# Patient Record
Sex: Male | Born: 1970 | Race: White | Hispanic: Yes | Marital: Single | Smoking: Current some day smoker
Health system: Southern US, Community
[De-identification: ages and names within clinical notes are randomized; demographics above are authoritative.]

## PROBLEM LIST (undated history)

## (undated) DIAGNOSIS — E119 Type 2 diabetes mellitus without complications: Secondary | ICD-10-CM

## (undated) DIAGNOSIS — Z720 Tobacco use: Secondary | ICD-10-CM

## (undated) DIAGNOSIS — E781 Pure hyperglyceridemia: Secondary | ICD-10-CM

## (undated) DIAGNOSIS — Z9114 Patient's other noncompliance with medication regimen: Secondary | ICD-10-CM

## (undated) DIAGNOSIS — I10 Essential (primary) hypertension: Secondary | ICD-10-CM

## (undated) DIAGNOSIS — R0789 Other chest pain: Secondary | ICD-10-CM

## (undated) DIAGNOSIS — Z91148 Patient's other noncompliance with medication regimen for other reason: Secondary | ICD-10-CM

---

## 2008-12-10 ENCOUNTER — Emergency Department (HOSPITAL_COMMUNITY): Admission: EM | Admit: 2008-12-10 | Discharge: 2008-12-10 | Payer: Self-pay | Admitting: Emergency Medicine

## 2010-09-15 ENCOUNTER — Emergency Department (HOSPITAL_COMMUNITY)
Admission: EM | Admit: 2010-09-15 | Discharge: 2010-09-16 | Disposition: A | Discharge status: Home / Self Care | Payer: Self-pay | Attending: Emergency Medicine | Admitting: Emergency Medicine

## 2010-09-15 DIAGNOSIS — S0990XA Unspecified injury of head, initial encounter: Secondary | ICD-10-CM | POA: Insufficient documentation

## 2010-09-15 DIAGNOSIS — W19XXXA Unspecified fall, initial encounter: Secondary | ICD-10-CM | POA: Insufficient documentation

## 2010-09-15 DIAGNOSIS — Y929 Unspecified place or not applicable: Secondary | ICD-10-CM | POA: Insufficient documentation

## 2010-09-15 DIAGNOSIS — R948 Abnormal results of function studies of other organs and systems: Secondary | ICD-10-CM | POA: Insufficient documentation

## 2010-09-15 DIAGNOSIS — R51 Headache: Secondary | ICD-10-CM | POA: Insufficient documentation

## 2010-09-15 DIAGNOSIS — R7309 Other abnormal glucose: Secondary | ICD-10-CM | POA: Insufficient documentation

## 2010-09-15 DIAGNOSIS — N289 Disorder of kidney and ureter, unspecified: Secondary | ICD-10-CM | POA: Insufficient documentation

## 2010-09-15 DIAGNOSIS — S01502A Unspecified open wound of oral cavity, initial encounter: Secondary | ICD-10-CM | POA: Insufficient documentation

## 2010-09-16 ENCOUNTER — Emergency Department (HOSPITAL_COMMUNITY): Payer: Self-pay

## 2010-09-16 LAB — POCT I-STAT, CHEM 8
Calcium, Ion: 1.05 mmol/L — ABNORMAL LOW (ref 1.12–1.32)
Glucose, Bld: 140 mg/dL — ABNORMAL HIGH (ref 70–99)
HCT: 45 % (ref 39.0–52.0)
Hemoglobin: 15.3 g/dL (ref 13.0–17.0)
TCO2: 27 mmol/L (ref 0–100)

## 2013-04-16 ENCOUNTER — Encounter (HOSPITAL_COMMUNITY): Payer: Self-pay | Admitting: Emergency Medicine

## 2013-04-16 ENCOUNTER — Emergency Department (HOSPITAL_COMMUNITY): Payer: Self-pay

## 2013-04-16 ENCOUNTER — Observation Stay (HOSPITAL_COMMUNITY)
Admission: EM | Admit: 2013-04-16 | Discharge: 2013-04-17 | DRG: 313 | Disposition: A | Discharge status: Home / Self Care | Payer: Self-pay | Attending: Cardiology | Admitting: Cardiology

## 2013-04-16 DIAGNOSIS — Z91199 Patient's noncompliance with other medical treatment and regimen due to unspecified reason: Secondary | ICD-10-CM

## 2013-04-16 DIAGNOSIS — R0789 Other chest pain: Principal | ICD-10-CM | POA: Diagnosis present

## 2013-04-16 DIAGNOSIS — F17200 Nicotine dependence, unspecified, uncomplicated: Secondary | ICD-10-CM | POA: Diagnosis present

## 2013-04-16 DIAGNOSIS — R072 Precordial pain: Secondary | ICD-10-CM | POA: Diagnosis present

## 2013-04-16 DIAGNOSIS — R9431 Abnormal electrocardiogram [ECG] [EKG]: Secondary | ICD-10-CM | POA: Diagnosis present

## 2013-04-16 DIAGNOSIS — I10 Essential (primary) hypertension: Secondary | ICD-10-CM | POA: Diagnosis present

## 2013-04-16 DIAGNOSIS — Z720 Tobacco use: Secondary | ICD-10-CM | POA: Diagnosis present

## 2013-04-16 DIAGNOSIS — Z9119 Patient's noncompliance with other medical treatment and regimen: Secondary | ICD-10-CM

## 2013-04-16 DIAGNOSIS — E781 Pure hyperglyceridemia: Secondary | ICD-10-CM | POA: Diagnosis present

## 2013-04-16 DIAGNOSIS — Z23 Encounter for immunization: Secondary | ICD-10-CM

## 2013-04-16 DIAGNOSIS — E119 Type 2 diabetes mellitus without complications: Secondary | ICD-10-CM | POA: Diagnosis present

## 2013-04-16 DIAGNOSIS — Z833 Family history of diabetes mellitus: Secondary | ICD-10-CM

## 2013-04-16 DIAGNOSIS — R079 Chest pain, unspecified: Secondary | ICD-10-CM

## 2013-04-16 HISTORY — DX: Essential (primary) hypertension: I10

## 2013-04-16 HISTORY — DX: Type 2 diabetes mellitus without complications: E11.9

## 2013-04-16 HISTORY — DX: Patient's other noncompliance with medication regimen: Z91.14

## 2013-04-16 HISTORY — DX: Patient's other noncompliance with medication regimen for other reason: Z91.148

## 2013-04-16 HISTORY — DX: Tobacco use: Z72.0

## 2013-04-16 HISTORY — DX: Other chest pain: R07.89

## 2013-04-16 HISTORY — DX: Pure hyperglyceridemia: E78.1

## 2013-04-16 LAB — CBC WITH DIFFERENTIAL/PLATELET
Basophils Absolute: 0.1 10*3/uL (ref 0.0–0.1)
Basophils Relative: 1 % (ref 0–1)
Eosinophils Absolute: 0.4 10*3/uL (ref 0.0–0.7)
Eosinophils Relative: 4 % (ref 0–5)
HCT: 45.8 % (ref 39.0–52.0)
Hemoglobin: 15.5 g/dL (ref 13.0–17.0)
Lymphocytes Relative: 28 % (ref 12–46)
Lymphs Abs: 2.8 10*3/uL (ref 0.7–4.0)
MCH: 31.7 pg (ref 26.0–34.0)
MCHC: 33.8 g/dL (ref 30.0–36.0)
MCV: 93.7 fL (ref 78.0–100.0)
Monocytes Absolute: 0.6 10*3/uL (ref 0.1–1.0)
Monocytes Relative: 6 % (ref 3–12)
Neutro Abs: 6.3 10*3/uL (ref 1.7–7.7)
Neutrophils Relative %: 62 % (ref 43–77)
Platelets: 212 10*3/uL (ref 150–400)
RBC: 4.89 MIL/uL (ref 4.22–5.81)
RDW: 12.4 % (ref 11.5–15.5)
WBC: 10.1 10*3/uL (ref 4.0–10.5)

## 2013-04-16 LAB — CBC
HCT: 43.2 % (ref 39.0–52.0)
Hemoglobin: 15.2 g/dL (ref 13.0–17.0)
MCH: 33.1 pg (ref 26.0–34.0)
MCHC: 35.2 g/dL (ref 30.0–36.0)
MCV: 94.1 fL (ref 78.0–100.0)
Platelets: 192 10*3/uL (ref 150–400)
RDW: 12.5 % (ref 11.5–15.5)

## 2013-04-16 LAB — BASIC METABOLIC PANEL
BUN: 19 mg/dL (ref 6–23)
CO2: 26 mEq/L (ref 19–32)
Calcium: 9.5 mg/dL (ref 8.4–10.5)
Chloride: 101 mEq/L (ref 96–112)
Creatinine, Ser: 0.83 mg/dL (ref 0.50–1.35)
GFR calc Af Amer: >90 mL/min (ref >90)
GFR calc non Af Amer: >90 mL/min (ref >90)
Glucose, Bld: 99 mg/dL (ref 70–99)
Potassium: 3.8 mEq/L (ref 3.5–5.1)
Sodium: 137 mEq/L (ref 135–145)

## 2013-04-16 LAB — HEPARIN LEVEL (UNFRACTIONATED): Heparin Unfractionated: 0.24 IU/mL — ABNORMAL LOW (ref 0.30–0.70)

## 2013-04-16 LAB — PROTIME-INR
INR: 0.94 (ref 0.00–1.49)
Prothrombin Time: 12.4 seconds (ref 11.6–15.2)

## 2013-04-16 LAB — APTT: aPTT: 28 seconds (ref 24–37)

## 2013-04-16 LAB — CREATININE, SERUM
Creatinine, Ser: 0.8 mg/dL (ref 0.50–1.35)
GFR calc Af Amer: >90 mL/min (ref >90)

## 2013-04-16 LAB — TROPONIN I
Troponin I: <0.3 ng/mL (ref <0.30)
Troponin I: <0.3 ng/mL (ref <0.30)

## 2013-04-16 IMAGING — CR DG CHEST 2V
2 series · 2 of 2 positions shown · non-contrast
Comparison: None.

CLINICAL DATA: Initial encounter for current episode of cough and
chest pain over the past 4 days. Smoker with current history of
hypertension and diabetes.

EXAM:
CHEST  2 VIEW

[w chest lat]
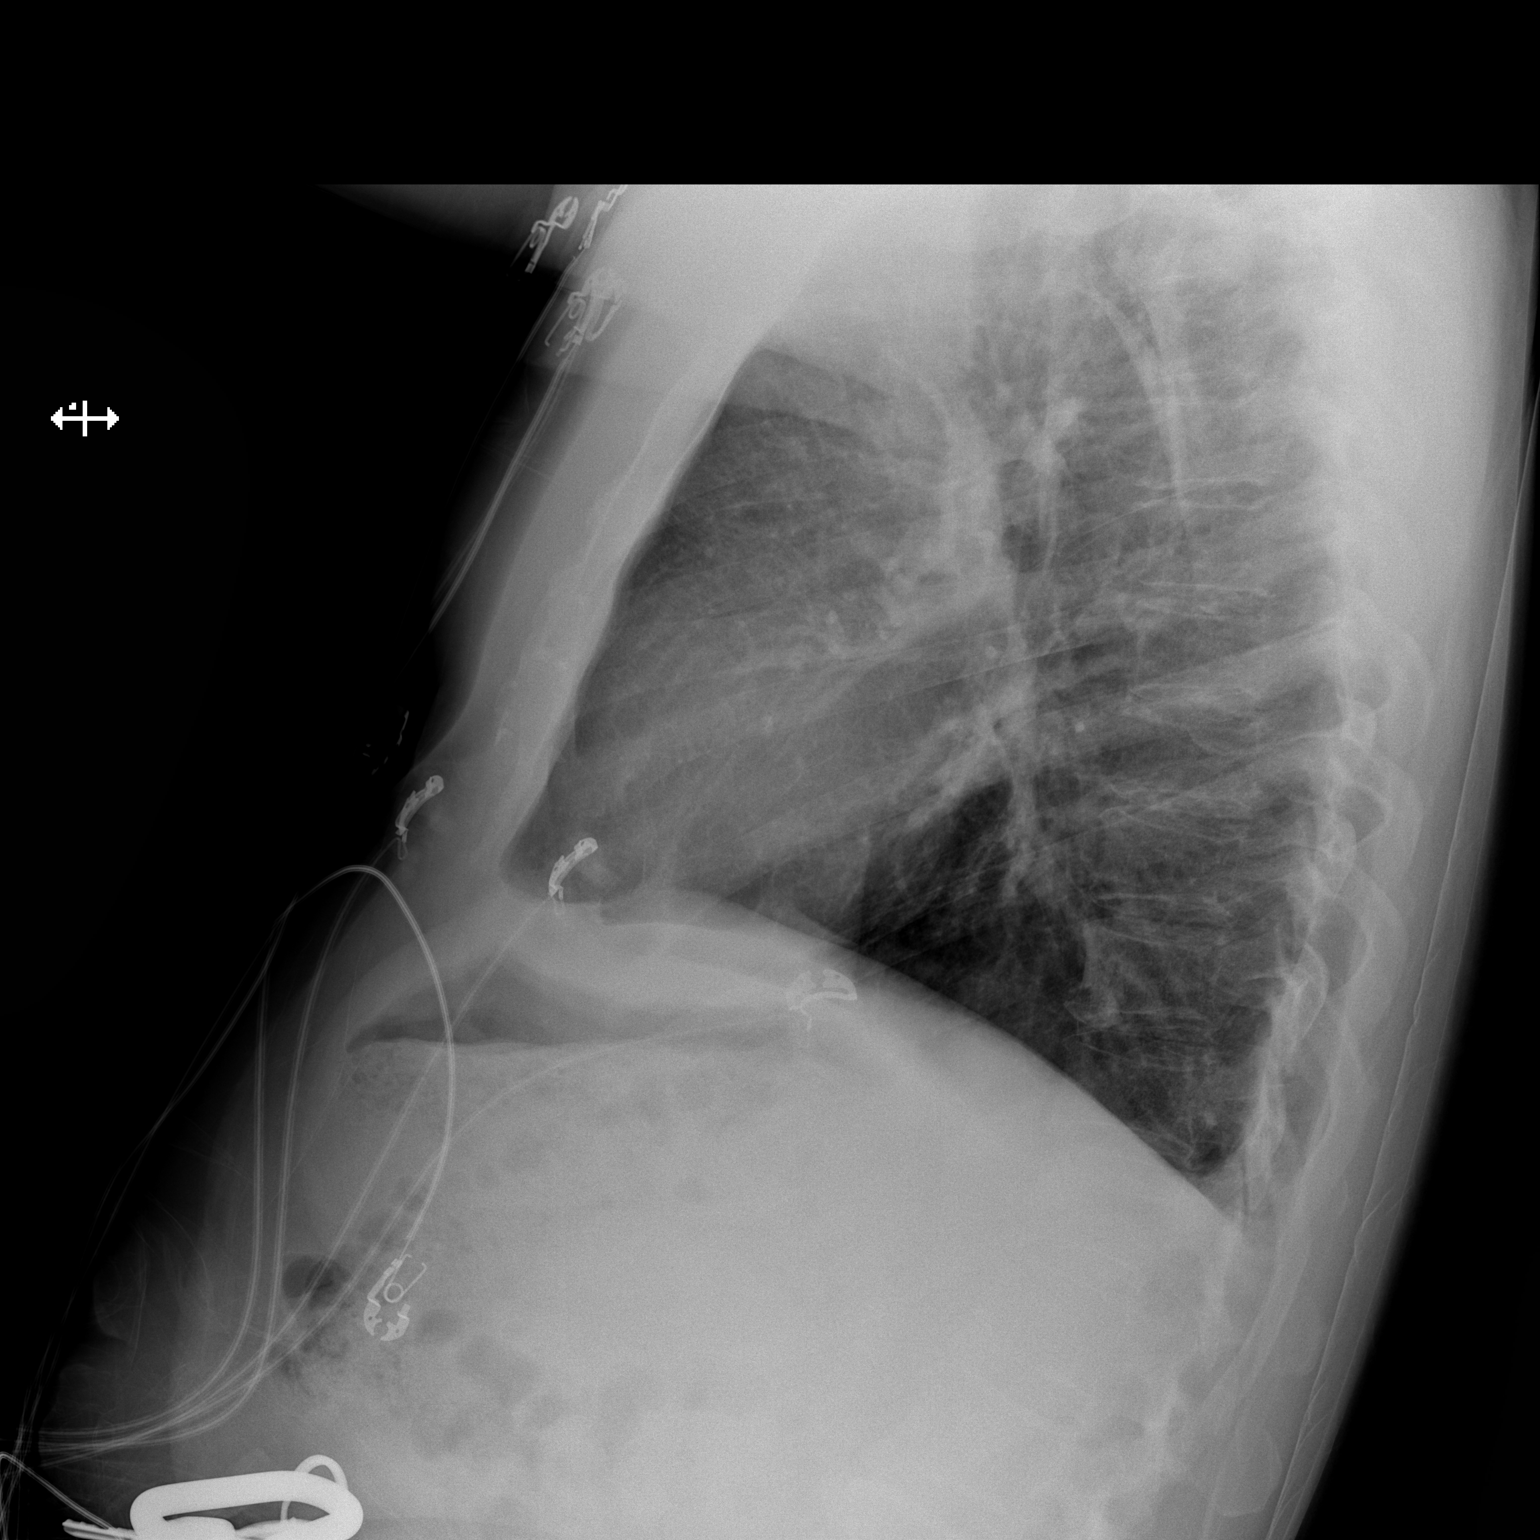

[x chest ap]
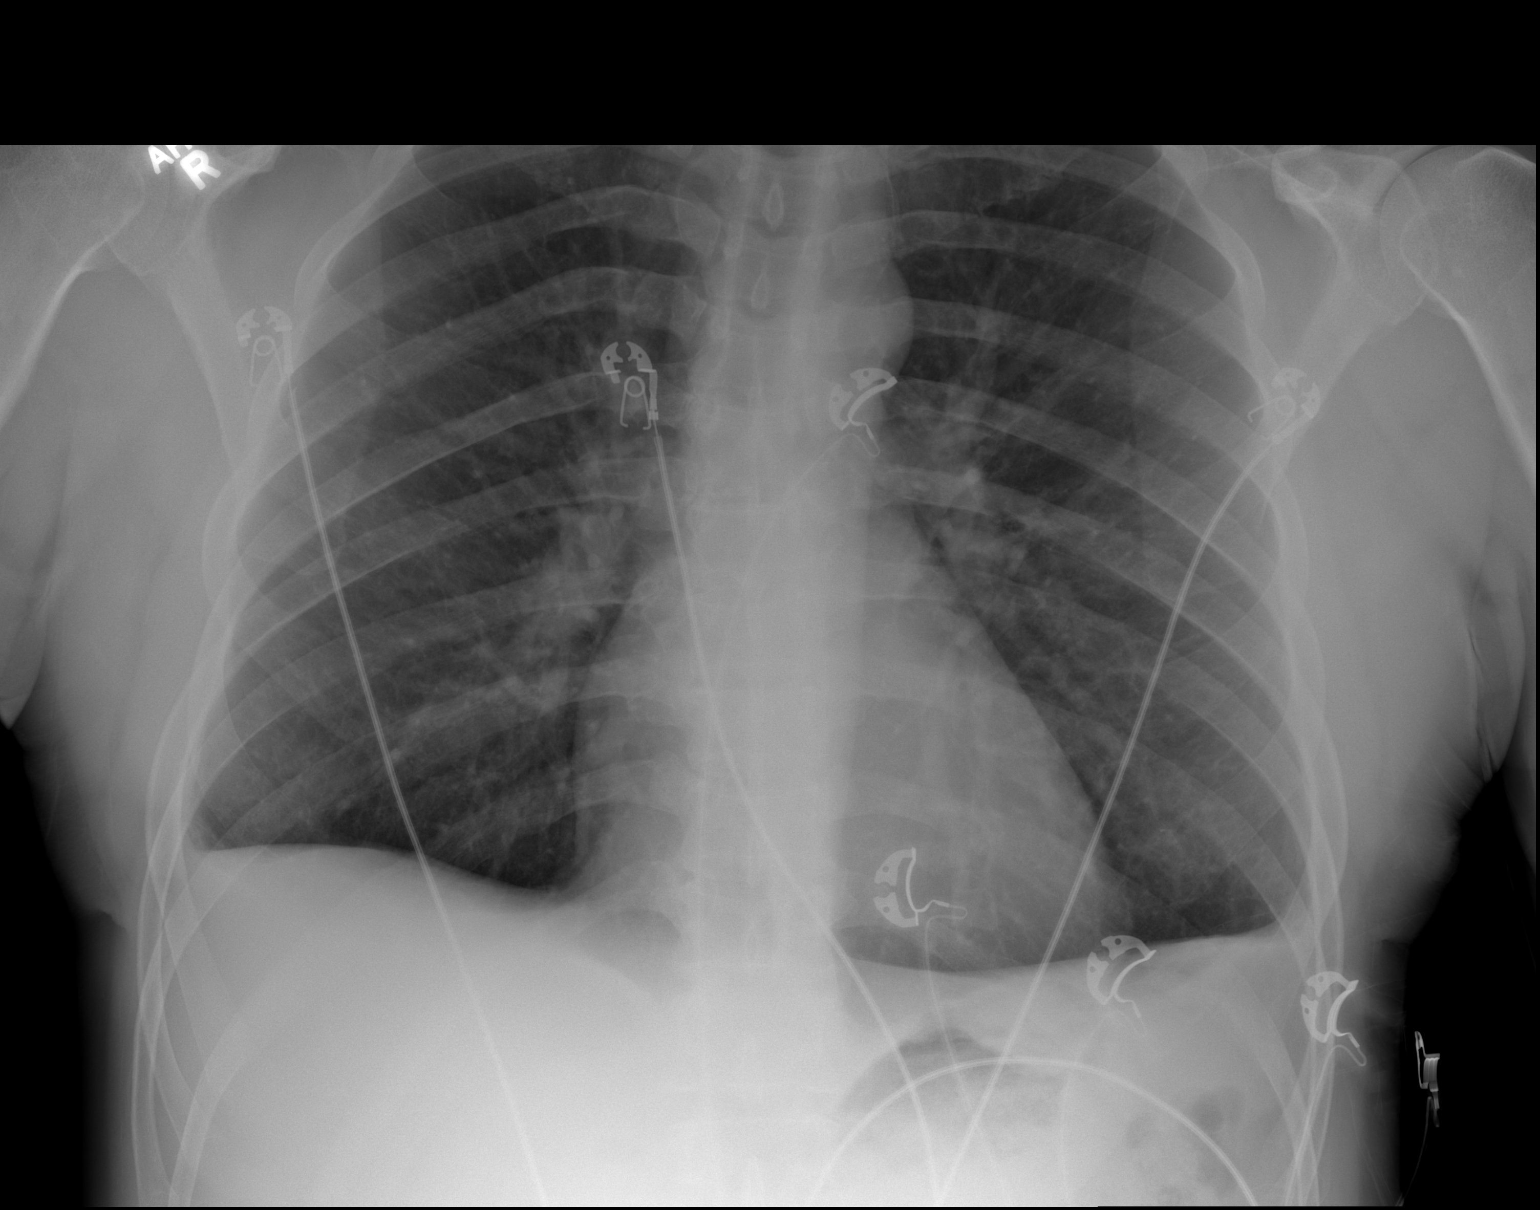

[2 of 2 positions shown; findings below may reference images not displayed]

FINDINGS: Cardiomediastinal silhouette unremarkable. Blunting of both
costophrenic angles. Lungs clear. Bronchovascular markings normal.
Pulmonary vascularity normal. No pneumothorax. Mild degenerative
changes involving the lower thoracic spine.
IMPRESSION: Pleuroparenchymal scarring at the bases with blunting of the
costophrenic angles is favored over bilateral pleural effusions. No
acute cardiopulmonary disease.

## 2013-04-16 MED ORDER — NITROGLYCERIN 0.4 MG SL SUBL
0.4000 mg | SUBLINGUAL_TABLET | SUBLINGUAL | Status: DC | PRN
  Filled 2013-04-16: qty 25

## 2013-04-16 MED ORDER — CLONIDINE HCL 0.1 MG PO TABS
0.1000 mg | ORAL_TABLET | Freq: Once | ORAL | Status: AC
  Administered 2013-04-16: 0.1 mg via ORAL
  Filled 2013-04-16: qty 1

## 2013-04-16 MED ORDER — METOPROLOL TARTRATE 1 MG/ML IV SOLN
5.0000 mg | Freq: Once | INTRAVENOUS | Status: AC
  Administered 2013-04-16: 5 mg via INTRAVENOUS
  Filled 2013-04-16: qty 5

## 2013-04-16 MED ORDER — ASPIRIN 81 MG PO CHEW
324.0000 mg | CHEWABLE_TABLET | Freq: Once | ORAL | Status: AC
  Administered 2013-04-16: 324 mg via ORAL
  Filled 2013-04-16: qty 4

## 2013-04-16 MED ORDER — ASPIRIN EC 81 MG PO TBEC
81.0000 mg | DELAYED_RELEASE_TABLET | Freq: Every day | ORAL | Status: DC
  Administered 2013-04-17: 81 mg via ORAL
  Filled 2013-04-16: qty 1

## 2013-04-16 MED ORDER — ASPIRIN 300 MG RE SUPP
300.0000 mg | RECTAL | Status: DC
  Filled 2013-04-16: qty 1

## 2013-04-16 MED ORDER — INSULIN ASPART 100 UNIT/ML ~~LOC~~ SOLN: 0.0000 [IU] | Freq: Three times a day (TID) | SUBCUTANEOUS | Status: DC

## 2013-04-16 MED ORDER — ASPIRIN 81 MG PO CHEW: 324.0000 mg | CHEWABLE_TABLET | ORAL | Status: DC

## 2013-04-16 MED ORDER — AMLODIPINE BESYLATE 10 MG PO TABS
10.0000 mg | ORAL_TABLET | Freq: Every day | ORAL | Status: DC
  Administered 2013-04-16 – 2013-04-17 (×2): 10 mg via ORAL
  Filled 2013-04-16 (×2): qty 1

## 2013-04-16 MED ORDER — NITROGLYCERIN 0.4 MG SL SUBL: 0.4000 mg | SUBLINGUAL_TABLET | SUBLINGUAL | Status: DC | PRN

## 2013-04-16 MED ORDER — INFLUENZA VAC SPLIT QUAD 0.5 ML IM SUSP
0.5000 mL | INTRAMUSCULAR | Status: AC
  Administered 2013-04-17: 0.5 mL via INTRAMUSCULAR
  Filled 2013-04-16: qty 0.5

## 2013-04-16 MED ORDER — ONDANSETRON HCL 4 MG/2ML IJ SOLN: 4.0000 mg | Freq: Four times a day (QID) | INTRAMUSCULAR | Status: DC | PRN

## 2013-04-16 MED ORDER — HEPARIN (PORCINE) IN NACL 100-0.45 UNIT/ML-% IJ SOLN
1000.0000 [IU]/h | INTRAMUSCULAR | Status: DC
  Administered 2013-04-16: 1000 [IU]/h via INTRAVENOUS
  Filled 2013-04-16 (×2): qty 250

## 2013-04-16 MED ORDER — ACETAMINOPHEN 325 MG PO TABS: 650.0000 mg | ORAL_TABLET | ORAL | Status: DC | PRN

## 2013-04-16 MED ORDER — HYDRALAZINE HCL 20 MG/ML IJ SOLN: 10.0000 mg | INTRAMUSCULAR | Status: DC | PRN

## 2013-04-16 MED ORDER — ENOXAPARIN SODIUM 40 MG/0.4ML ~~LOC~~ SOLN
40.0000 mg | SUBCUTANEOUS | Status: DC
  Administered 2013-04-16: 40 mg via SUBCUTANEOUS
  Filled 2013-04-16 (×2): qty 0.4

## 2013-04-16 MED ORDER — HEPARIN BOLUS VIA INFUSION
4000.0000 [IU] | Freq: Once | INTRAVENOUS | Status: AC
  Administered 2013-04-16: 4000 [IU] via INTRAVENOUS
  Filled 2013-04-16: qty 4000

## 2013-04-16 NOTE — ED Notes (Signed)
Pt c/o intermittent chest pain x 2 weeks. Pt sts it radiates to lower back. Pt c/o dizziness, Nausea when his chest pain starts. Pt sts pain feels like a heavy pressure on chest. Pt sts sister has hx of heart problems.

## 2013-04-16 NOTE — ED Notes (Signed)
Pt from home reports that he has had CPx 2 weeks. Pt reports when he has pain, it causes SOB, dizziness nausea, but no emesis. Pt is non-english speaking, daughter-in-law translating. Pt is A&O and in NAD

## 2013-04-16 NOTE — Progress Notes (Signed)
ANTICOAGULATION CONSULT NOTE - Initial Consult  Pharmacy Consult for Heparin Indication: chest pain/ACS  No Known Allergies  Patient Measurements: Weight: 171 lb 12.8 oz (77.928 kg)  Vital Signs: Temp: 99.1 F (37.3 C) (11/01 1501) Temp src: Oral (11/01 1501) BP: 155/97 mmHg (11/01 1501) Pulse Rate: 76 (11/01 1501)  Labs:  Recent Labs  04/16/13 1545  HGB 15.5  HCT 45.8  PLT 212  APTT 28  LABPROT 12.4  INR 0.94  CREATININE 0.83  TROPONINI <0.30    CrCl is unknown because there is no height on file for the current visit.   Medical History: Past Medical History  Diagnosis Date  . Hypertension   . Diabetes mellitus without complication      Assessment: 29 yoM with PMH of HTN and DM presents with radiating chest pain x 2 weeks.  IV heparin ordered for ACS.  Baseline aPTT, Protime/INR, CBC WNL.  Baseline SCr WNL, CrCl > 100 ml/min.  Weight in ED: 77.9 kg  Goal of Therapy:  Heparin level 0.3-0.7 units/ml Monitor platelets by anticoagulation protocol: Yes   Plan:  1.  Heparin 4000 unit bolus x 1 followed by start of infusion at 1000 units/hr (10 mL/hr). 2.  Heparin level 6 hours following start of infusion. 3.  Daily CBC and heparin level while on heparin.  Clance Boll 04/16/2013,4:46 PM

## 2013-04-16 NOTE — H&P (Addendum)
PCP: urgent care on [address]  HPI:  42 y/o Spanish-speaking male with h/o DM2, HTN and tobacco use. Presents with 2 week h/o CP. H/o obtained through nurse interpreter.   Denies h/o known CAD. Says about 2 weeks ago began to develop CP. Comes multiple times a day. Can happen with rest or exertion. Describes it as pinpricks. Last a few minutes then resolves. No associated symptoms. Says pain has been on/off very frequently so finally came to ER.  In ER, pain free. Cardiac markers normal. Ecg with SR. LVH with TWI I, AvL. (no old) SBP 160/130  Says he hasn't taken his DM2 or HTN meds in over a month. Denies melena, BRBPR or NSAID use. Drinks 2-3 beers per day. Smokes ~ 5 cigarettes per day.   Review of Systems:     Cardiac Review of Systems: {Y] = yes [ ]  = no  Chest Pain [ y   ]  Resting SOB [   ] Exertional SOB  [  ]  Orthopnea [  ]   Pedal Edema [   ]    Palpitations [  ] Syncope  [  ]   Presyncope [   ]  General Review of Systems: [Y] = yes [  ]=no Constitional: recent weight change [  ]; anorexia [  ]; fatigue [  ]; nausea [  ]; night sweats [  ]; fever [  ]; or chills [  ];                                                                                                                                           Eye : blurred vision [  ]; diplopia [   ]; vision changes [  ];  Amaurosis fugax[  ]; Resp: cough [  ];  wheezing[  ];  hemoptysis[  ]; shortness of breath[  ]; paroxysmal nocturnal dyspnea[  ]; dyspnea on exertion[  ]; or orthopnea[  ];  GI:  gallstones[  ], vomiting[  ];  dysphagia[  ]; melena[  ];  hematochezia [  ]; heartburn[  ];   Hx of  Colonoscopy[  ]; GU: kidney stones [  ]; hematuria[  ];   dysuria [  ];  nocturia[  ];  history of     obstruction [  ];                 Skin: rash, swelling[  ];, hair loss[  ];  peripheral edema[  ];  or itching[  ]; Musculosketetal: myalgias[  ];  joint swelling[  ];  joint erythema[  ];  joint pain[  ];  back pain[   ];  Heme/Lymph: bruising[  ];  bleeding[  ];  anemia[  ];  Neuro: TIA[  ];  headaches[  ];  stroke[  ];  vertigo[  ];  seizures[  ];  paresthesias[  ];  difficulty walking[  ];  Psych:depression[  ]; anxiety[  ];  Endocrine: diabetes[y  ];  thyroid dysfunction[  ];  Other:  Past Medical History  Diagnosis Date  . Hypertension   . Diabetes mellitus without complication      (Not in a hospital admission)   No Known Allergies  History   Social History  . Marital Status: Single    Spouse Name: N/A    Number of Children: N/A  . Years of Education: N/A   Occupational History  . Carpenter    Social History Main Topics  . Smoking status: Current Some Day Smoker -- 0.50 packs/day    Types: Cigarettes  . Smokeless tobacco: Not on file  . Alcohol Use: 6 - 9 oz/week    10-15 Cans of beer per week     Comment: sociially  . Drug Use: No  . Sexual Activity: Not on file   Other Topics Concern  . Not on file   Social History Narrative   Works as Music therapist    Family History  Problem Relation Age of Onset  . Diabetes Mother     deceased  Father unknown No family HX of premature CAD  PHYSICAL EXAM: Filed Vitals:   04/16/13 1712  BP: 140/93  Pulse: 79  Temp:   Resp: 20   General:  Well appearing. No respiratory difficulty HEENT: normal Neck: supple. no JVD. Carotids 2+ bilat; no bruits. No lymphadenopathy or thryomegaly appreciated. Cor: PMI nondisplaced. Regular rate & rhythm. No rubs, gallops or murmurs. Lungs: clear Abdomen: soft, nontender, nondistended. No hepatosplenomegaly. No bruits or masses. Good bowel sounds. Extremities: no cyanosis, clubbing, rash, edema + tattoos Neuro: alert & oriented x 3, cranial nerves grossly intact. moves all 4 extremities w/o difficulty. Affect pleasant.  ECG: as per HPI  Results for orders placed during the hospital encounter of 04/16/13 (from the past 24 hour(s))  CBC WITH DIFFERENTIAL     Status: None   Collection Time     04/16/13  3:45 PM      Result Value Range   WBC 10.1  4.0 - 10.5 K/uL   RBC 4.89  4.22 - 5.81 MIL/uL   Hemoglobin 15.5  13.0 - 17.0 g/dL   HCT 16.1  09.6 - 04.5 %   MCV 93.7  78.0 - 100.0 fL   MCH 31.7  26.0 - 34.0 pg   MCHC 33.8  30.0 - 36.0 g/dL   RDW 40.9  81.1 - 91.4 %   Platelets 212  150 - 400 K/uL   Neutrophils Relative % 62  43 - 77 %   Neutro Abs 6.3  1.7 - 7.7 K/uL   Lymphocytes Relative 28  12 - 46 %   Lymphs Abs 2.8  0.7 - 4.0 K/uL   Monocytes Relative 6  3 - 12 %   Monocytes Absolute 0.6  0.1 - 1.0 K/uL   Eosinophils Relative 4  0 - 5 %   Eosinophils Absolute 0.4  0.0 - 0.7 K/uL   Basophils Relative 1  0 - 1 %   Basophils Absolute 0.1  0.0 - 0.1 K/uL  BASIC METABOLIC PANEL     Status: None   Collection Time    04/16/13  3:45 PM      Result Value Range   Sodium 137  135 - 145 mEq/L   Potassium 3.8  3.5 - 5.1 mEq/L   Chloride 101  96 - 112 mEq/L  CO2 26  19 - 32 mEq/L   Glucose, Bld 99  70 - 99 mg/dL   BUN 19  6 - 23 mg/dL   Creatinine, Ser 4.09  0.50 - 1.35 mg/dL   Calcium 9.5  8.4 - 81.1 mg/dL   GFR calc non Af Amer >90  >90 mL/min   GFR calc Af Amer >90  >90 mL/min  TROPONIN I     Status: None   Collection Time    04/16/13  3:45 PM      Result Value Range   Troponin I <0.30  <0.30 ng/mL  APTT     Status: None   Collection Time    04/16/13  3:45 PM      Result Value Range   aPTT 28  24 - 37 seconds  PROTIME-INR     Status: None   Collection Time    04/16/13  3:45 PM      Result Value Range   Prothrombin Time 12.4  11.6 - 15.2 seconds   INR 0.94  0.00 - 1.49   Dg Chest 2 View  04/16/2013   CLINICAL DATA:  Initial encounter for current episode of cough and chest pain over the past 4 days. Smoker with current history of hypertension and diabetes.  EXAM: CHEST  2 VIEW  COMPARISON:  None.  FINDINGS: Cardiomediastinal silhouette unremarkable. Blunting of both costophrenic angles. Lungs clear. Bronchovascular markings normal. Pulmonary vascularity normal.  No pneumothorax. Mild degenerative changes involving the lower thoracic spine.  IMPRESSION: Pleuroparenchymal scarring at the bases with blunting of the costophrenic angles is favored over bilateral pleural effusions. No acute cardiopulmonary disease.   Electronically Signed   By: Hulan Saas M.D.   On: 04/16/2013 16:14     ASSESSMENT:  1. Precordial chest pain 2. Abnormal ECG 3. HTN, uncotnrolled 4. Diabetes 5. Medication noncompliance 6. ETOH use 7. Tobacco use ongoing  PLAN/DISCUSSION:  Chest pain is fairly atypical but persistent. Given abnormal ECG and multiple cardiac risk factors will keep overnight for observation and plan treadmill myoview in am. Will transfer to [facility] to facilitate am stress test. Will treat with ASA and BP control. Hold b-blocker due to stress test. Check lipids and HGBA1c. Will cover DM2 with SSI.   Daryl Doyle 7:51 PM

## 2013-04-16 NOTE — ED Provider Notes (Signed)
CSN: [id number]     Arrival date & time 04/16/13  1448 History   First MD Initiated Contact with Patient 04/16/13 1531     Chief Complaint  Patient presents with  . Chest Pain   (Consider location/radiation/quality/duration/timing/severity/associated sxs/prior Treatment) HPI  42 year old male with chest pain. Intermittent for the past 2 weeks. Becoming more frequent. Pain comes and goes. Sometimes with exertion, but not consistently. Facet the same pain at rest. Describes pain as a pressure in the center of his chest. Associated with some shortness of breath and mild nausea. No vomiting. No diaphoresis. No palpitations. No fevers or chills. No cough. Patient is a smoker, history of hypertension and diabetes. No known history of coronary artery disease. Has never had a stress test. History obtained through family translating.  Past Medical History  Diagnosis Date  . Hypertension   . Diabetes mellitus without complication    History reviewed. No pertinent past surgical history. No family history on file. History  Substance Use Topics  . Smoking status: Current Some Day Smoker -- 0.50 packs/day    Types: Cigarettes  . Smokeless tobacco: Not on file  . Alcohol Use: Yes     Comment: sociially    Review of Systems  All systems reviewed and negative, other than as noted in HPI.   Allergies  Review of patient's allergies indicates no known allergies.  Home Medications  No current outpatient prescriptions on file. BP 140/93  Pulse 79  Temp(Src) 99.1 F (37.3 C) (Oral)  Resp 20  Wt 171 lb 12.8 oz (77.928 kg)  SpO2 98% Physical Exam  Nursing note and vitals reviewed. Constitutional: He appears well-developed and well-nourished. No distress.  HENT:  Head: Normocephalic and atraumatic.  Eyes: Conjunctivae are normal. Right eye exhibits no discharge. Left eye exhibits no discharge.  Neck: Neck supple.  Cardiovascular: Normal rate, regular rhythm and normal heart sounds.  Exam  reveals no gallop and no friction rub.   No murmur heard. Pulmonary/Chest: Effort normal and breath sounds normal. No respiratory distress. He exhibits no tenderness.  Abdominal: Soft. He exhibits no distension. There is no tenderness.  Musculoskeletal: He exhibits no edema and no tenderness.  Lower extremities symmetric as compared to each other. No calf tenderness. Negative Homan's. No palpable cords.   Neurological: He is alert.  Skin: Skin is warm and dry.  Psychiatric: He has a normal mood and affect. His behavior is normal. Thought content normal.    ED Course  Procedures (including critical care time) Labs Review Labs Reviewed  CBC WITH DIFFERENTIAL  BASIC METABOLIC PANEL  TROPONIN I  APTT  PROTIME-INR  HEPARIN LEVEL (UNFRACTIONATED)  CBC  HEPARIN LEVEL (UNFRACTIONATED)   Imaging Review Dg Chest 2 View  04/16/2013   CLINICAL DATA:  Initial encounter for current episode of cough and chest pain over the past 4 days. Smoker with current history of hypertension and diabetes.  EXAM: CHEST  2 VIEW  COMPARISON:  None.  FINDINGS: Cardiomediastinal silhouette unremarkable. Blunting of both costophrenic angles. Lungs clear. Bronchovascular markings normal. Pulmonary vascularity normal. No pneumothorax. Mild degenerative changes involving the lower thoracic spine.  IMPRESSION: Pleuroparenchymal scarring at the bases with blunting of the costophrenic angles is favored over bilateral pleural effusions. No acute cardiopulmonary disease.   Electronically Signed   By: Hulan Saas M.D.   On: 04/16/2013 16:14    EKG Interpretation     Ventricular Rate:  84 PR Interval:  144 QRS Duration: 88 QT Interval:  350 QTC Calculation:  414 R Axis:   72 Text Interpretation:  Sinus rhythm left atrial enlargement Lateral Q waves noted T wave inversion I and aVL LVH, consider strain pattern Borderline ST elevation, anterior leads            MDM   1. Chest pain   2. Precordial chest  pain   3. Abnormal ECG   4. Diabetes mellitus   5. Uncontrolled hypertension    42 year old male with chest pain. Some concern for unstable angina. EKG with LVH and likely strain pattern. No old for comparison. Admission.    Raeford Razor, MD 04/21/13 [phone]

## 2013-04-16 NOTE — ED Notes (Signed)
MD at bedside. 

## 2013-04-17 ENCOUNTER — Inpatient Hospital Stay (HOSPITAL_COMMUNITY): Payer: Self-pay

## 2013-04-17 ENCOUNTER — Encounter (HOSPITAL_COMMUNITY): Payer: Self-pay | Admitting: Physician Assistant

## 2013-04-17 ENCOUNTER — Other Ambulatory Visit: Payer: Self-pay

## 2013-04-17 DIAGNOSIS — E781 Pure hyperglyceridemia: Secondary | ICD-10-CM | POA: Diagnosis present

## 2013-04-17 DIAGNOSIS — R079 Chest pain, unspecified: Secondary | ICD-10-CM

## 2013-04-17 LAB — LIPID PANEL
HDL: 50 mg/dL (ref >39)
LDL Cholesterol: UNDETERMINED mg/dL (ref 0–99)
Total CHOL/HDL Ratio: 3.9 RATIO
Triglycerides: 481 mg/dL — ABNORMAL HIGH (ref <150)

## 2013-04-17 LAB — HEMOGLOBIN A1C
Hgb A1c MFr Bld: 5.6 %
Mean Plasma Glucose: 114 mg/dL

## 2013-04-17 LAB — TROPONIN I: Troponin I: <0.3 ng/mL (ref <0.30)

## 2013-04-17 LAB — GLUCOSE, CAPILLARY: Glucose-Capillary: 97 mg/dL (ref 70–99)

## 2013-04-17 MED ORDER — TECHNETIUM TC 99M SESTAMIBI - CARDIOLITE
30.0000 | Freq: Once | INTRAVENOUS | Status: AC | PRN
  Administered 2013-04-17: 30 via INTRAVENOUS

## 2013-04-17 MED ORDER — TECHNETIUM TC 99M SESTAMIBI GENERIC - CARDIOLITE
10.0000 | Freq: Once | INTRAVENOUS | Status: AC | PRN
  Administered 2013-04-17: 10 via INTRAVENOUS

## 2013-04-17 MED ORDER — AMLODIPINE BESYLATE 10 MG PO TABS: 10.0000 mg | ORAL_TABLET | Freq: Every day | ORAL | Status: DC

## 2013-04-17 NOTE — Progress Notes (Signed)
DC instructions provided to patient via family member for interpreter. No questions at this time. IV removed with tip intact. Herat monitor cleaned and returned to front. Levonne Spiller, RN

## 2013-04-17 NOTE — Progress Notes (Signed)
Patient: Daryl Doyle / Admit Date: 04/16/2013 / Date of Encounter: 04/17/2013, 9:01 AM   Subjective  No further CP or SOB. Daughter in law at bedside to translate. No CP or SOB on treadmill - test limited by hypertensive response to exercise up to 208/101 but reached target HR and held adequately.  Objective   Telemetry: unable to review as seen down in nuc, MD to review when back up to floor EKG reviewed - suspect original tracing had reversed limb leads  Physical Exam: Blood pressure 114/76, pulse 65, temperature 98.4 F (36.9 C), temperature source Oral, resp. rate 16, height 5\' 5"  (1.651 m), weight 175 lb 9.6 oz (79.652 kg), SpO2 98.00%. General: Well developed, well nourished, in no acute distress. Head: Normocephalic, atraumatic, sclera non-icteric, no xanthomas, nares are without discharge. Neck: Negative for carotid bruits. JVP not elevated. Lungs: Clear bilaterally to auscultation without wheezes, rales, or rhonchi. Breathing is unlabored. Heart: RRR S1 S2 without murmurs, rubs, or gallops.  Abdomen: Soft, non-tender, non-distended with normoactive bowel sounds. No rebound/guarding. Extremities: No clubbing or cyanosis. No edema. Distal pedal pulses are 2+ and equal bilaterally. Neuro: Alert and oriented X 3. Moves all extremities spontaneously. Psych:  Normal affect.  No intake or output data in the 24 hours ending 04/17/13 0901  Inpatient Medications:  . amLODipine  10 mg Oral Daily  . aspirin  324 mg Oral NOW   Or  . aspirin  300 mg Rectal NOW  . aspirin EC  81 mg Oral Daily  . enoxaparin (LOVENOX) injection  40 mg Subcutaneous Q24H  . influenza vac split quadrivalent PF  0.5 mL Intramuscular Tomorrow-1000  . insulin aspart  0-15 Units Subcutaneous TID WC   Infusions:    Labs:  Recent Labs  04/16/13 1545 04/16/13 2235  NA 137  --   K 3.8  --   CL 101  --   CO2 26  --   GLUCOSE 99  --   BUN 19  --   CREATININE 0.83 0.80  CALCIUM 9.5  --      Recent Labs  04/16/13 1545 04/16/13 2235  WBC 10.1 10.8*  NEUTROABS 6.3  --   HGB 15.5 15.2  HCT 45.8 43.2  MCV 93.7 94.1  PLT 212 192    Recent Labs  04/16/13 1545 04/16/13 2235 04/17/13 0410  TROPONINI <0.30 <0.30 <0.30     Radiology/Studies:  Dg Chest 2 View  04/16/2013   CLINICAL DATA:  Initial encounter for current episode of cough and chest pain over the past 4 days. Smoker with current history of hypertension and diabetes.  EXAM: CHEST  2 VIEW  COMPARISON:  None.  FINDINGS: Cardiomediastinal silhouette unremarkable. Blunting of both costophrenic angles. Lungs clear. Bronchovascular markings normal. Pulmonary vascularity normal. No pneumothorax. Mild degenerative changes involving the lower thoracic spine.  IMPRESSION: Pleuroparenchymal scarring at the bases with blunting of the costophrenic angles is favored over bilateral pleural effusions. No acute cardiopulmonary disease.   Electronically Signed   By: Hulan Saas M.D.   On: 04/16/2013 16:14     Assessment and Plan  1. Chest pain, atypical, with cardiac risk factors and abnormal EKG - for nuc today. 2. HTN uncontrolled - received clonidine and norvasc last night - improved. Cont Norvasc. 3. Diabetes mellitus - A1C pending. CBGs not uncontrolled in house. F/u PCP. 4. Medication noncompliance - hasn't taken meds in over a month. Compliance reinforced.  5. Habitual EtOH use - educated on cutting down. 6. Habitual tobacco  abuse - counseled to quit. 7. Hypertriglyceridemia - will d/w MD.   Signed, Ronie Spies PA-C Patient seen and examined. I agree with the assessment and plan as detailed above. See also my additional thoughts below.   The nuclear exercise test reveals no significant abnormalities. No further workup is needed. The patient can be discharged.  [name], MD, [facility] 04/17/2013 2:13 PM

## 2013-04-17 NOTE — Discharge Summary (Signed)
Discharge Summary   Patient ID: Daryl Doyle MRN: [id number], DOB/AGE: [birth year] 42 y.o. Admit date: 04/16/2013 D/C date:     04/17/2013  Primary Cardiologist: New this admission, seen by Dr. Dewitt Rota  Primary Discharge Diagnoses:  1. Chest pain, atypical - nuclear stress test negative this admission, ruled out for MI 2. HTN uncontrolled 3. Diabetes mellitus 4. Medication noncompliance 5. Habitual EtOH use 6. Habitual tobacco abuse 7. Hypertriglyceridemia  Hospital Course:Daryl Doyle is a 42 y/o Spanish-speaking male with h/o DM2, HTN, tobacco use, and recent medication noncompliance. He has no known history of CAD. About 2 weeks ago he began to develop CP. This could happen multiple times a day with rest or exertion. He described it as pinpricks, lasting a few minutes then resolving. There are no assicated symptoms. The pain has been on/off frequently so came to the ER. In the ER, he was pain free. EKG showed nonspecific ST-T changes and initial troponin was negative. SBP was 160/130 and the patient admitted to not taking his DM2 or HTN meds in over a month. He drinks 2-3 beers per days as well as smokes 5 cigarettes per day. He was given clonidine and Norvasc for his BP acutely then started on daily Norvasc. Although chest pain was felt atypical, given cardiac risk factors he was admitted and observed overnight. Troponins remained negative. CXR was nonacute. He was not tachycardic, tachypnic or hypoxic. He underwent stress Cardiolite this morning which was normal. He had hypertensive response to exercise but this was before he'd had his Norvasc for the day. He will continue Norvasc and was urged to f/u PCP for continued monitoring of his diabetes, hypertension, as well as hypertriglyceridemia found this admission. He was instructed to monitor BP at home. He was advised on cutting down EtOH and smoking cessation. Diet education was provided at discharge. Dr. Myrtis Ser has seen and  examined the patient today and feels he is stable for discharge.   Discharge Vitals: Blood pressure 145/88, pulse 69, temperature 98.4 F (36.9 C), temperature source Oral, resp. rate 18, height 5\' 5"  (1.651 m), weight 175 lb 9.6 oz (79.652 kg), SpO2 98.00%.  Labs: Lab Results  Component Value Date   WBC 10.8* 04/16/2013   HGB 15.2 04/16/2013   HCT 43.2 04/16/2013   MCV 94.1 04/16/2013   PLT 192 04/16/2013     Recent Labs Lab 04/16/13 1545 04/16/13 2235  NA 137  --   K 3.8  --   CL 101  --   CO2 26  --   BUN 19  --   CREATININE 0.83 0.80  CALCIUM 9.5  --   GLUCOSE 99  --     Recent Labs  04/16/13 1545 04/16/13 2235 04/17/13 0410  TROPONINI <0.30 <0.30 <0.30   Lab Results  Component Value Date   CHOL 197 04/17/2013   HDL 50 04/17/2013   LDLCALC UNABLE TO CALCULATE IF TRIGLYCERIDE OVER 400 mg/dL 14/12/8293   TRIG 621* 30/01/6577    Diagnostic Studies/Procedures   Dg Chest 2 View  04/16/2013   CLINICAL DATA:  Initial encounter for current episode of cough and chest pain over the past 4 days. Smoker with current history of hypertension and diabetes.  EXAM: CHEST  2 VIEW  COMPARISON:  None.  FINDINGS: Cardiomediastinal silhouette unremarkable. Blunting of both costophrenic angles. Lungs clear. Bronchovascular markings normal. Pulmonary vascularity normal. No pneumothorax. Mild degenerative changes involving the lower thoracic spine.  IMPRESSION: Pleuroparenchymal scarring at the bases with blunting of the costophrenic  angles is favored over bilateral pleural effusions. No acute cardiopulmonary disease.   Electronically Signed   By: Hulan Saas M.D.   On: 04/16/2013 16:14   Nm Myocar Multi W/spect W/wall Motion / Ef  04/17/2013   CLINICAL DATA:  42 year old with chest pain.  EXAM: MYOCARDIAL IMAGING WITH SPECT (REST AND EXERCISE)  GATED LEFT VENTRICULAR WALL MOTION STUDY  LEFT VENTRICULAR EJECTION FRACTION  TECHNIQUE: Standard myocardial SPECT imaging was performed after  resting intravenous injection of 10 mCi Tc-68m sestamibi. Subsequently, exercise tolerance test was performed by the patient under the supervision of the Cardiology staff. At peak-stress, 30 mCi Tc-51m sestamibi was injected intravenously and standard myocardial SPECT imaging was performed. Quantitative gated imaging was also performed to evaluate left ventricular wall motion, and estimate left ventricular ejection fraction.  COMPARISON:  Chest radiograph 04/16/2013  FINDINGS: The left ventricle myocardial perfusion is normal on the stress images. There is no evidence for a fixed or reversible defect. The left ventricle wall motion is within normal limits. The calculated ejection fraction is 61% with an end-diastolic volume of 87 mL and end systolic volume of 34 mL.  IMPRESSION: Normal myocardial perfusion stress examination. No evidence for stress-induced ischemia.  Calculated ejection fraction is 61%.   Electronically Signed   By: Richarda Overlie M.D.   On: 04/17/2013 13:12    Discharge Medications     Medication List         amLODipine 10 MG tablet  Commonly known as:  NORVASC  Take 1 tablet (10 mg total) by mouth daily.        Disposition   The patient will be discharged in stable condition to home. Discharge Orders   Future Orders Complete By Expires   Diet - low sodium heart healthy  As directed    Scheduling Instructions:     See the last page of this document for diet education.   Discharge instructions  As directed    Comments:     Please see your primary care doctor to evaluate for non-heart causes of your chest pain.  Please monitor your blood pressure occasionally at home. Call your doctor if you tend to get readings of greater than 130 on the top number or 80 on the bottom number.   Increase activity slowly  As directed    Scheduling Instructions:     You may return to work.     Follow-up Information   Follow up with Primary Care Doctor. (Please call your primary care  doctor tomorrow to discuss getting back on your diabetes medicines. You will need an appointment to follow up your diabetes, blood pressure, and high triglyceride level (a type of cholesterol). )         Duration of Discharge Encounter: Greater than 30 minutes including physician and PA time.  Signed, Ronie Spies PA-C 04/17/2013, 3:01 PM Patient seen and examined. I agree with the assessment and plan as detailed above. See also my additional thoughts below.   I made the decision for the patient to be discharged. I spoke with the patient and his wife. I agree with this discharge summary as outlined.  [name], MD, [facility] 04/17/2013 3:11 PM

## 2013-04-18 NOTE — Progress Notes (Signed)
UR completed. Nelvin Tomb RN CCM Case Mgmt 

## 2013-08-07 ENCOUNTER — Other Ambulatory Visit (HOSPITAL_COMMUNITY): Payer: Self-pay | Admitting: Physician Assistant

## 2019-08-22 ENCOUNTER — Other Ambulatory Visit: Payer: Self-pay

## 2019-08-22 ENCOUNTER — Ambulatory Visit (INDEPENDENT_AMBULATORY_CARE_PROVIDER_SITE_OTHER): Payer: Self-pay

## 2019-08-22 ENCOUNTER — Encounter (HOSPITAL_COMMUNITY): Payer: Self-pay | Admitting: Emergency Medicine

## 2019-08-22 ENCOUNTER — Ambulatory Visit (HOSPITAL_COMMUNITY)
Admission: EM | Admit: 2019-08-22 | Discharge: 2019-08-22 | Disposition: A | Discharge status: Home / Self Care | Payer: Self-pay | Attending: Urgent Care | Admitting: Urgent Care

## 2019-08-22 DIAGNOSIS — R079 Chest pain, unspecified: Secondary | ICD-10-CM

## 2019-08-22 DIAGNOSIS — M545 Low back pain, unspecified: Secondary | ICD-10-CM

## 2019-08-22 DIAGNOSIS — W19XXXA Unspecified fall, initial encounter: Secondary | ICD-10-CM

## 2019-08-22 DIAGNOSIS — R109 Unspecified abdominal pain: Secondary | ICD-10-CM

## 2019-08-22 DIAGNOSIS — R319 Hematuria, unspecified: Secondary | ICD-10-CM

## 2019-08-22 DIAGNOSIS — S2231XA Fracture of one rib, right side, initial encounter for closed fracture: Secondary | ICD-10-CM

## 2019-08-22 DIAGNOSIS — I10 Essential (primary) hypertension: Secondary | ICD-10-CM

## 2019-08-22 DIAGNOSIS — R0781 Pleurodynia: Secondary | ICD-10-CM

## 2019-08-22 DIAGNOSIS — R030 Elevated blood-pressure reading, without diagnosis of hypertension: Secondary | ICD-10-CM

## 2019-08-22 LAB — POCT URINALYSIS DIP (DEVICE)
Bilirubin Urine: NEGATIVE
Glucose, UA: NEGATIVE mg/dL
Ketones, ur: NEGATIVE mg/dL
Leukocytes,Ua: NEGATIVE
Nitrite: NEGATIVE
Protein, ur: NEGATIVE mg/dL
Specific Gravity, Urine: >=1.03 (ref 1.005–1.030)
Urobilinogen, UA: 0.2 mg/dL (ref 0.0–1.0)
pH: 5.5 (ref 5.0–8.0)

## 2019-08-22 MED ORDER — HYDROCODONE-ACETAMINOPHEN 5-325 MG PO TABS
1.0000 | ORAL_TABLET | Freq: Once | ORAL | Status: AC
  Administered 2019-08-22: 1 via ORAL

## 2019-08-22 MED ORDER — HYDROCODONE-ACETAMINOPHEN 5-325 MG PO TABS
ORAL_TABLET | ORAL | Status: AC
  Filled 2019-08-22: qty 1

## 2019-08-22 MED ORDER — AMLODIPINE BESYLATE 5 MG PO TABS: 5.0000 mg | ORAL_TABLET | Freq: Every day | ORAL | 0 refills | Status: DC

## 2019-08-22 MED ORDER — TRAMADOL HCL 50 MG PO TABS: 50.0000 mg | ORAL_TABLET | Freq: Four times a day (QID) | ORAL | 0 refills | Status: DC | PRN

## 2019-08-22 MED ORDER — TIZANIDINE HCL 4 MG PO TABS: 4.0000 mg | ORAL_TABLET | Freq: Four times a day (QID) | ORAL | 0 refills | Status: DC | PRN

## 2019-08-22 NOTE — ED Provider Notes (Signed)
[facility]   MRN: [id number] DOB: 1971-01-27  Subjective:   Daryl Doyle is a 49 y.o. male presenting for 4-day history of persistent right sided flank and low back pain.  Symptoms started from suffering an accidental fall off of a chair landing on a stepping stool over his right torso.  Patient states that his symptoms were tolerable until he sneezed and then had significant pain over said areas.  He has been using Tylenol with minimal relief.  He is unable to sleep or rest comfortably due to his pain.  Denies hematuria.  Has a history of uncontrolled hypertension, has been out of his medication for several months now.  Has not gone back to see any of his PCPs at [facility].  No current facility-administered medications for this encounter.  Current Outpatient Medications:    amLODipine (NORVASC) 10 MG tablet, TAKE 1 TABLET BY MOUTH EVERY DAY, Disp: 30 tablet, Rfl: 0   No Known Allergies  Past Medical History:  Diagnosis Date   Chest pain, atypical    a. Adm 04/2013 -> ruled out, negative stress test.   Diabetes mellitus without complication (HCC)    Hypertension    Hypertriglyceridemia    Noncompliance with medication regimen    Tobacco use      History reviewed. No pertinent surgical history.  Family History  Problem Relation Age of Onset   Diabetes Mother        deceased    Social History   Tobacco Use   Smoking status: Current Some Day Smoker    Packs/day: 0.25    Years: 1.00    Pack years: 0.25    Types: Cigarettes  Substance Use Topics   Alcohol use: Yes    Alcohol/week: 9.0 standard drinks    Types: 9 Cans of beer per week    Comment: sociially   Drug use: No    ROS   Objective:   Vitals: BP (!) 181/108    Pulse 93    Temp 98.8 F (37.1 C) (Oral)    Resp 16    SpO2 98%   BP Readings from Last 3 Encounters:  08/22/19 (!) 181/108  04/17/13 (!) 145/88   Physical Exam Constitutional:      General: He is not in  acute distress.    Appearance: Normal appearance. He is well-developed and normal weight. He is not ill-appearing, toxic-appearing or diaphoretic.  HENT:     Head: Normocephalic and atraumatic.     Right Ear: External ear normal.     Left Ear: External ear normal.     Nose: Nose normal.     Mouth/Throat:     Mouth: Mucous membranes are moist.     Pharynx: Oropharynx is clear.  Eyes:     General: No scleral icterus.       Right eye: No discharge.        Left eye: No discharge.     Extraocular Movements: Extraocular movements intact.     Pupils: Pupils are equal, round, and reactive to light.  Cardiovascular:     Rate and Rhythm: Normal rate and regular rhythm.     Heart sounds: Normal heart sounds. No murmur. No friction rub. No gallop.   Pulmonary:     Effort: Pulmonary effort is normal. No respiratory distress.     Breath sounds: Normal breath sounds. No stridor. No wheezing, rhonchi or rales.  Musculoskeletal:     Cervical back: Normal range of motion.  Lumbar back: Edema (trace), spasms, tenderness and bony tenderness (over posterior lower ribs as outlined) present. No swelling, deformity, signs of trauma or lacerations. Decreased range of motion.       Back:  Skin:    General: Skin is warm and dry.  Neurological:     Mental Status: He is alert and oriented to person, place, and time.     Cranial Nerves: No cranial nerve deficit.     Motor: No weakness.     Coordination: Coordination normal.     Gait: Gait normal.  Psychiatric:        Mood and Affect: Mood normal.        Behavior: Behavior normal.        Thought Content: Thought content normal.        Judgment: Judgment normal.    DG Ribs Unilateral W/Chest Right  Result Date: 08/22/2019 CLINICAL DATA:  Recent fall with right-sided chest pain, initial encounter EXAM: RIGHT RIBS AND CHEST - 3+ VIEW COMPARISON:  None. FINDINGS: Cardiac shadow is within normal limits. The lungs are well aerated bilaterally. No focal  infiltrate, effusion or pneumothorax is seen. Undisplaced right twelfth rib fracture is noted posteriorly. No other focal abnormality is noted. IMPRESSION: Right twelfth rib fracture without complicating factors. Electronically Signed   By: Alcide Clever M.D.   On: 08/22/2019 18:36    Results for orders placed or performed during the hospital encounter of 08/22/19 (from the past 24 hour(s))  POCT urinalysis dip (device)     Status: Abnormal   Collection Time: 08/22/19  6:20 PM  Result Value Ref Range   Glucose, UA NEGATIVE NEGATIVE mg/dL   Bilirubin Urine NEGATIVE NEGATIVE   Ketones, ur NEGATIVE NEGATIVE mg/dL   Specific Gravity, Urine >=1.030 1.005 - 1.030   Hgb urine dipstick MODERATE (A) NEGATIVE   pH 5.5 5.0 - 8.0   Protein, ur NEGATIVE NEGATIVE mg/dL   Urobilinogen, UA 0.2 0.0 - 1.0 mg/dL   Nitrite NEGATIVE NEGATIVE   Leukocytes,Ua NEGATIVE NEGATIVE    Assessment and Plan :   1. Closed fracture of one rib of right side, initial encounter   2. Fall, initial encounter   3. Rib pain on right side   4. Right flank pain   5. Acute right-sided low back pain without sciatica   6. Essential hypertension   7. Elevated blood pressure reading   8. Hematuria, unspecified type     Counseled patient on management of his rib fracture.  Will avoid NSAID use due to his uncontrolled hypertension.  Emphasized need to follow-up with his PCP, information provided to patient, counseled on dietary modifications, restart amlodipine at 5 mg once daily.  At this time patient does not have any neurologic findings warranting ER visit for intracranial process. Counseled patient on potential for adverse effects with medications prescribed/recommended today, ER and return-to-clinic precautions discussed, patient verbalized understanding.    Wallis Bamberg, [place] 08/23/19 [phone]

## 2019-08-22 NOTE — ED Triage Notes (Addendum)
PT fell Friday and injured back and ribs. Reports severe pain in back and ribs. Denies shortness of breath.   BP high at triage. Has been off BP meds for 8 months due to no PCP access

## 2019-08-24 ENCOUNTER — Telehealth (HOSPITAL_COMMUNITY): Payer: Self-pay | Admitting: Family Medicine

## 2019-08-24 MED ORDER — AMLODIPINE BESYLATE 5 MG PO TABS: 5.0000 mg | ORAL_TABLET | Freq: Every day | ORAL | 0 refills | Status: AC

## 2019-08-24 MED ORDER — TRAMADOL HCL 50 MG PO TABS: 50.0000 mg | ORAL_TABLET | Freq: Four times a day (QID) | ORAL | 0 refills | Status: AC | PRN

## 2019-08-24 MED ORDER — TIZANIDINE HCL 4 MG PO TABS: 4.0000 mg | ORAL_TABLET | Freq: Four times a day (QID) | ORAL | 0 refills | Status: AC | PRN

## 2019-08-24 NOTE — Telephone Encounter (Signed)
Medications reordered and sent to [facility] on [address]

## 2022-04-10 ENCOUNTER — Other Ambulatory Visit: Payer: Self-pay

## 2022-04-10 ENCOUNTER — Encounter (HOSPITAL_COMMUNITY): Payer: Self-pay | Admitting: Emergency Medicine

## 2022-04-10 ENCOUNTER — Emergency Department (HOSPITAL_COMMUNITY)
Admission: EM | Admit: 2022-04-10 | Discharge: 2022-04-11 | Disposition: A | Discharge status: Home / Self Care | Payer: Self-pay | Attending: Emergency Medicine | Admitting: Emergency Medicine

## 2022-04-10 DIAGNOSIS — T1502XA Foreign body in cornea, left eye, initial encounter: Secondary | ICD-10-CM | POA: Insufficient documentation

## 2022-04-10 DIAGNOSIS — H18892 Other specified disorders of cornea, left eye: Secondary | ICD-10-CM | POA: Insufficient documentation

## 2022-04-10 DIAGNOSIS — X18XXXA Contact with other hot metals, initial encounter: Secondary | ICD-10-CM | POA: Insufficient documentation

## 2022-04-10 DIAGNOSIS — T1592XA Foreign body on external eye, part unspecified, left eye, initial encounter: Secondary | ICD-10-CM

## 2022-04-10 NOTE — ED Triage Notes (Signed)
Pt reports getting metal in his left eye while at work today. Pt reports blurry vision and pain.

## 2022-04-10 NOTE — ED Provider Triage Note (Signed)
Emergency Medicine Provider Triage Evaluation Note  Daryl Doyle , a 51 y.o. male  was evaluated in triage.  Pt complains of metal stuck in left eye.  Associated with blurry vision, photophobia.  Pain with eye movement.  Does not wear contacts or glasses at baseline..  Review of Systems  Per HPI  Physical Exam  BP (!) 176/105 (BP Location: Right Arm)   Pulse 86   Temp 98.7 F (37.1 C) (Oral)   Resp 16   SpO2 98%  Gen:   Awake, no distress   Resp:  Normal effort  MSK:   Moves extremities without difficulty  Other:  EOMI, injected conjunctive a.  PERRLA.  Cannot visualize metal  Medical Decision Making  Medically screening exam initiated at 3:06 PM.  Appropriate orders placed.  Daryl Doyle was informed that the remainder of the evaluation will be completed by another provider, this initial triage assessment does not replace that evaluation, and the importance of remaining in the ED until their evaluation is complete.     Sherrill Raring, PA-C 04/10/22 1507

## 2022-04-11 MED ORDER — TETRACAINE HCL 0.5 % OP SOLN
1.0000 [drp] | Freq: Once | OPHTHALMIC | Status: AC
  Administered 2022-04-11: 2 [drp] via OPHTHALMIC

## 2022-04-11 MED ORDER — ERYTHROMYCIN 5 MG/GM OP OINT
1.0000 | TOPICAL_OINTMENT | Freq: Once | OPHTHALMIC | Status: AC
  Administered 2022-04-11: 1 via OPHTHALMIC
  Filled 2022-04-11: qty 3.5

## 2022-04-11 MED ORDER — OXYCODONE-ACETAMINOPHEN 5-325 MG PO TABS: 2.0000 | ORAL_TABLET | ORAL | 0 refills | Status: AC | PRN

## 2022-04-11 MED ORDER — OXYCODONE-ACETAMINOPHEN 5-325 MG PO TABS
2.0000 | ORAL_TABLET | Freq: Once | ORAL | Status: AC
  Administered 2022-04-11: 2 via ORAL
  Filled 2022-04-11: qty 2

## 2022-04-11 NOTE — ED Provider Notes (Signed)
[facility] EMERGENCY DEPARTMENT Provider Note   CSN: [id number] Arrival date & time: 04/10/22  1309     History  Chief Complaint  Patient presents with   Foreign Body in [place] is a 51 y.o. male.  51 year old male who presents the ER today secondary to a piece of metal in his eye.  Patient states he was sanding on a trailer when a piece of metal flew up into his eye.  Could not get it out and has pain related to it.  Has blurry vision but no vision loss.  No injuries elsewhere.   Foreign Body in [place] Medications Prior to Admission medications   Medication Sig Start Date End Date Taking? Authorizing Provider  oxyCODONE-acetaminophen (PERCOCET) 5-325 MG tablet Take 2 tablets by mouth every 4 (four) hours as needed. 04/11/22  Yes Kayloni Rocco, Corene Cornea, MD  amLODipine (NORVASC) 5 MG tablet Take 1 tablet (5 mg total) by mouth daily. 08/24/19   Loura Halt A, NP  tiZANidine (ZANAFLEX) 4 MG tablet Take 1 tablet (4 mg total) by mouth every 6 (six) hours as needed for muscle spasms. 08/24/19   Loura Halt A, NP  traMADol (ULTRAM) 50 MG tablet Take 1 tablet (50 mg total) by mouth every 6 (six) hours as needed. 08/24/19   Orvan July, NP      Allergies    Patient has no known allergies.    Review of Systems   Review of Systems  Physical Exam Updated Vital Signs BP (!) 155/82   Pulse 74   Temp 98.1 F (36.7 C) (Oral)   Resp 18   SpO2 97%  Physical Exam Vitals and nursing note reviewed.  Constitutional:      Appearance: He is well-developed.  HENT:     Head: Normocephalic and atraumatic.     Mouth/Throat:     Mouth: Mucous membranes are moist.     Pharynx: Oropharynx is clear.  Eyes:     Pupils: Pupils are equal, round, and reactive to light.     Comments: Small piece of metal at that approximately 1 to 2 o'clock position of his cornea in the left eye.  No other foreign bodies.  Cardiovascular:     Rate and Rhythm: Normal rate.   Pulmonary:     Effort: Pulmonary effort is normal. No respiratory distress.  Abdominal:     General: Abdomen is flat. There is no distension.  Musculoskeletal:        General: No swelling or tenderness. Normal range of motion.     Cervical back: Normal range of motion.  Skin:    General: Skin is warm and dry.  Neurological:     General: No focal deficit present.     Mental Status: He is alert.    ED Results / Procedures / Treatments   Labs (all labs ordered are listed, but only abnormal results are displayed) Labs Reviewed - No data to display  EKG None  Radiology No results found.  Procedures .Foreign Body Removal  Date/Time: 04/11/2022 2:10 AM  Performed by: Merrily Pew, MD Authorized by: Merrily Pew, MD  Consent: Verbal consent obtained. Risks and benefits: risks, benefits and alternatives were discussed Consent given by: patient Patient understanding: patient states understanding of the procedure being performed Patient consent: the patient's understanding of the procedure matches consent given Patient identity confirmed: verbally with patient Body area: eye Location details: left cornea Anesthesia method:  topical application.  Anesthesia: Local Anesthetic: tetracaine drops  Sedation: Patient sedated: no  Patient cooperative: yes Localization method: eyelid eversion and visualized Removal mechanism: moist cotton swab and irrigation (could not) Eye not examined with fluorescein. Corneal abrasion size: small Corneal abrasion location: lateral and superior Residual rust ring present. Dressing: antibiotic ointment Depth: embedded Complexity: simple 1 objects recovered. Objects recovered: small piece of metal Post-procedure assessment: foreign body removed Patient tolerance: patient tolerated the procedure well with no immediate complications      Medications Ordered in ED Medications  tetracaine (PONTOCAINE) 0.5 % ophthalmic solution 1-2 drop  (2 drops Left Eye Given 04/11/22 0129)  erythromycin ophthalmic ointment 1 Application (1 Application Left Eye Given 04/11/22 0138)  oxyCODONE-acetaminophen (PERCOCET/ROXICET) 5-325 MG per tablet 2 tablet (2 tablets Oral Given 04/11/22 0137)    ED Course/ Medical Decision Making/ A&P                           Medical Decision Making Risk Prescription drug management.   Metal removed. Erythromycin ointment given. Discussed urgent ophtho follow up for reevaluation today or Monday.    Final Clinical Impression(s) / ED Diagnoses Final diagnoses:  Foreign body of left eye, initial encounter  Corneal defect, left    Rx / DC Orders ED Discharge Orders          Ordered    oxyCODONE-acetaminophen (PERCOCET) 5-325 MG tablet  Every 4 hours PRN        04/11/22 0138              Lenzy Kerschner, Barbara Cower, MD 04/11/22 [phone]
